# Patient Record
Sex: Female | Born: 2005 | Race: White | Hispanic: No | Marital: Single | Smoking: Never smoker
Health system: Southern US, Community
[De-identification: ages and names within clinical notes are randomized; demographics above are authoritative.]

## PROBLEM LIST (undated history)

## (undated) DIAGNOSIS — J302 Other seasonal allergic rhinitis: Secondary | ICD-10-CM

## (undated) DIAGNOSIS — J45909 Unspecified asthma, uncomplicated: Secondary | ICD-10-CM

## (undated) DIAGNOSIS — E669 Obesity, unspecified: Secondary | ICD-10-CM

## (undated) HISTORY — DX: Obesity, unspecified: E66.9

## (undated) HISTORY — DX: Unspecified asthma, uncomplicated: J45.909

---
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
## 2024-01-06 DIAGNOSIS — Z0000 Encounter for general adult medical examination without abnormal findings: Secondary | ICD-10-CM | POA: Diagnosis not present

## 2024-01-06 DIAGNOSIS — I10 Essential (primary) hypertension: Secondary | ICD-10-CM | POA: Diagnosis not present

## 2024-01-06 DIAGNOSIS — Z1159 Encounter for screening for other viral diseases: Secondary | ICD-10-CM | POA: Diagnosis not present

## 2024-01-06 DIAGNOSIS — Z6841 Body Mass Index (BMI) 40.0 and over, adult: Secondary | ICD-10-CM | POA: Diagnosis not present

## 2024-01-06 DIAGNOSIS — E559 Vitamin D deficiency, unspecified: Secondary | ICD-10-CM | POA: Diagnosis not present

## 2024-01-06 DIAGNOSIS — M129 Arthropathy, unspecified: Secondary | ICD-10-CM | POA: Diagnosis not present

## 2024-01-06 DIAGNOSIS — R0602 Shortness of breath: Secondary | ICD-10-CM | POA: Diagnosis not present

## 2024-01-06 DIAGNOSIS — D539 Nutritional anemia, unspecified: Secondary | ICD-10-CM | POA: Diagnosis not present

## 2024-01-06 DIAGNOSIS — E039 Hypothyroidism, unspecified: Secondary | ICD-10-CM | POA: Diagnosis not present

## 2024-01-28 DIAGNOSIS — J310 Chronic rhinitis: Secondary | ICD-10-CM | POA: Diagnosis not present

## 2024-01-28 DIAGNOSIS — I10 Essential (primary) hypertension: Secondary | ICD-10-CM | POA: Diagnosis not present

## 2024-01-28 DIAGNOSIS — E559 Vitamin D deficiency, unspecified: Secondary | ICD-10-CM | POA: Diagnosis not present

## 2024-01-28 DIAGNOSIS — Z6841 Body Mass Index (BMI) 40.0 and over, adult: Secondary | ICD-10-CM | POA: Diagnosis not present

## 2024-01-28 DIAGNOSIS — F419 Anxiety disorder, unspecified: Secondary | ICD-10-CM | POA: Diagnosis not present

## 2024-01-28 DIAGNOSIS — Z01419 Encounter for gynecological examination (general) (routine) without abnormal findings: Secondary | ICD-10-CM | POA: Diagnosis not present

## 2024-01-28 DIAGNOSIS — E7800 Pure hypercholesterolemia, unspecified: Secondary | ICD-10-CM | POA: Diagnosis not present

## 2024-01-28 DIAGNOSIS — F32A Depression, unspecified: Secondary | ICD-10-CM | POA: Diagnosis not present

## 2024-02-20 DIAGNOSIS — F332 Major depressive disorder, recurrent severe without psychotic features: Secondary | ICD-10-CM | POA: Diagnosis not present

## 2024-02-20 DIAGNOSIS — F411 Generalized anxiety disorder: Secondary | ICD-10-CM | POA: Diagnosis not present

## 2024-02-20 DIAGNOSIS — F4522 Body dysmorphic disorder: Secondary | ICD-10-CM | POA: Diagnosis not present

## 2024-02-24 DIAGNOSIS — Z0120 Encounter for dental examination and cleaning without abnormal findings: Secondary | ICD-10-CM | POA: Diagnosis not present

## 2024-03-03 DIAGNOSIS — J069 Acute upper respiratory infection, unspecified: Secondary | ICD-10-CM | POA: Diagnosis not present

## 2024-03-03 DIAGNOSIS — R509 Fever, unspecified: Secondary | ICD-10-CM | POA: Diagnosis not present

## 2024-03-06 DIAGNOSIS — D539 Nutritional anemia, unspecified: Secondary | ICD-10-CM | POA: Diagnosis not present

## 2024-03-06 DIAGNOSIS — K5909 Other constipation: Secondary | ICD-10-CM | POA: Diagnosis not present

## 2024-03-06 DIAGNOSIS — M129 Arthropathy, unspecified: Secondary | ICD-10-CM | POA: Diagnosis not present

## 2024-03-06 DIAGNOSIS — A09 Infectious gastroenteritis and colitis, unspecified: Secondary | ICD-10-CM | POA: Diagnosis not present

## 2024-03-06 DIAGNOSIS — Z6841 Body Mass Index (BMI) 40.0 and over, adult: Secondary | ICD-10-CM | POA: Diagnosis not present

## 2024-03-06 DIAGNOSIS — R5383 Other fatigue: Secondary | ICD-10-CM | POA: Diagnosis not present

## 2024-03-06 DIAGNOSIS — R112 Nausea with vomiting, unspecified: Secondary | ICD-10-CM | POA: Diagnosis not present

## 2024-03-06 DIAGNOSIS — E559 Vitamin D deficiency, unspecified: Secondary | ICD-10-CM | POA: Diagnosis not present

## 2024-03-18 DIAGNOSIS — Z0120 Encounter for dental examination and cleaning without abnormal findings: Secondary | ICD-10-CM | POA: Diagnosis not present

## 2024-03-19 DIAGNOSIS — F419 Anxiety disorder, unspecified: Secondary | ICD-10-CM | POA: Diagnosis not present

## 2024-03-19 DIAGNOSIS — E611 Iron deficiency: Secondary | ICD-10-CM | POA: Diagnosis not present

## 2024-03-19 DIAGNOSIS — E7800 Pure hypercholesterolemia, unspecified: Secondary | ICD-10-CM | POA: Diagnosis not present

## 2024-03-19 DIAGNOSIS — Z01419 Encounter for gynecological examination (general) (routine) without abnormal findings: Secondary | ICD-10-CM | POA: Diagnosis not present

## 2024-03-19 DIAGNOSIS — F32A Depression, unspecified: Secondary | ICD-10-CM | POA: Diagnosis not present

## 2024-03-19 DIAGNOSIS — E559 Vitamin D deficiency, unspecified: Secondary | ICD-10-CM | POA: Diagnosis not present

## 2024-03-19 DIAGNOSIS — A09 Infectious gastroenteritis and colitis, unspecified: Secondary | ICD-10-CM | POA: Diagnosis not present

## 2024-03-19 DIAGNOSIS — I10 Essential (primary) hypertension: Secondary | ICD-10-CM | POA: Diagnosis not present

## 2024-03-19 DIAGNOSIS — J310 Chronic rhinitis: Secondary | ICD-10-CM | POA: Diagnosis not present

## 2024-03-22 DIAGNOSIS — Z0120 Encounter for dental examination and cleaning without abnormal findings: Secondary | ICD-10-CM | POA: Diagnosis not present

## 2024-03-31 DIAGNOSIS — F411 Generalized anxiety disorder: Secondary | ICD-10-CM | POA: Diagnosis not present

## 2024-03-31 DIAGNOSIS — F332 Major depressive disorder, recurrent severe without psychotic features: Secondary | ICD-10-CM | POA: Diagnosis not present

## 2024-03-31 DIAGNOSIS — F4522 Body dysmorphic disorder: Secondary | ICD-10-CM | POA: Diagnosis not present

## 2024-04-09 DIAGNOSIS — F332 Major depressive disorder, recurrent severe without psychotic features: Secondary | ICD-10-CM | POA: Diagnosis not present

## 2024-04-09 DIAGNOSIS — F411 Generalized anxiety disorder: Secondary | ICD-10-CM | POA: Diagnosis not present

## 2024-04-09 DIAGNOSIS — F4522 Body dysmorphic disorder: Secondary | ICD-10-CM | POA: Diagnosis not present

## 2024-09-20 ENCOUNTER — Other Ambulatory Visit: Payer: Self-pay

## 2024-09-20 ENCOUNTER — Ambulatory Visit

## 2024-09-20 DIAGNOSIS — R262 Difficulty in walking, not elsewhere classified: Secondary | ICD-10-CM | POA: Insufficient documentation

## 2024-09-20 DIAGNOSIS — M25562 Pain in left knee: Secondary | ICD-10-CM | POA: Insufficient documentation

## 2024-09-20 DIAGNOSIS — M6281 Muscle weakness (generalized): Secondary | ICD-10-CM | POA: Insufficient documentation

## 2024-09-20 NOTE — Therapy (Signed)
 OUTPATIENT PHYSICAL THERAPY LOWER EXTREMITY EVALUATION   Patient Name: Latoya Castillo MRN: [id number] DOB:02/06/2006, 18 y.o., female Today's Date: 09/20/2024  END OF SESSION:  PT End of Session - 09/20/24 1604     Visit Number 1    Date for Recertification  12/13/24    PT Start Time 1351    PT Stop Time 1430    PT Time Calculation (min) 39 min    Activity Tolerance Patient tolerated treatment well    Behavior During Therapy [facility] for tasks assessed/performed          Past Medical History:  Diagnosis Date   Asthma    Obesity    Seasonal allergies    History reviewed. No pertinent surgical history. 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   Goiter 10/24/2016   Insulin  resistance 10/24/2016    PCP: [facility]  REFERRING PROVIDER: Juliene Bailey, MD  REFERRING DIAG: L patellar subluxation  THERAPY DIAG:  Acute pain of left knee - Plan: PT plan of care cert/re-cert  Difficulty in walking, not elsewhere classified - Plan: PT plan of care cert/re-cert  Muscle weakness (generalized) - Plan: PT plan of care cert/re-cert  Rationale for Evaluation and Treatment: Rehabilitation  ONSET DATE: Jul 25 2024  SUBJECTIVE:   SUBJECTIVE STATEMENT: Reports pain and swelling L knee, fearful to walk much   PERTINENT HISTORY: Fell at home, 2 x in a row, felt like something moved in her L knee, now pain and swelling PAIN:  Are you having pain? Yes: NPRS scale: 0 to 5 Pain location: L knee primarily inferior patellar tendon Pain description: aching, more stiff in am Aggravating factors: walking, lying supine  Relieving factors: rest  PRECAUTIONS: None  RED FLAGS: None   WEIGHT BEARING RESTRICTIONS:  No  FALLS:  Has patient fallen in last 6 months? Yes. 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  DIAGNOSTIC FINDINGS: not available  PATIENT SURVEYS:  Lower Extremity Functional Score: 35 / 80 = 43.8 %  COGNITION: Overall cognitive status: Within functional limits for tasks assessed     SENSATION: WFL  EDEMA:  Mod edema knee jt anterior  MUSCLE LENGTH: Hamstrings: Right wfl deg; Left wfl deg Debby test: Right wfl deg; Left wfl deg  POSTURE: No Significant postural limitations  PALPATION: Tender inferior patellar tendon. Normal movement patella all planes  LOWER EXTREMITY ROM:  Active ROM Right eval Left eval  Hip flexion    Hip extension    Hip abduction    Hip adduction    Hip internal rotation    Hip external rotation    Knee flexion 148 120  Knee extension 0 -20  Ankle dorsiflexion    Ankle plantarflexion    Ankle inversion    Ankle eversion     (Blank rows =  not tested)  LOWER EXTREMITY MMT:  MMT Right eval Left eval  Hip flexion    Hip extension    Hip abduction  Pain L knee, 4/5  Hip adduction    Hip internal rotation    Hip external rotation    Knee flexion  4/5  Knee extension  3+/5 pain  Ankle dorsiflexion  heel walk unable unable  Ankle plantarflexion toe walk unable unable  Ankle inversion    Ankle eversion     (Blank rows = not tested)  LOWER EXTREMITY SPECIAL TESTS:  na  FUNCTIONAL TESTS:  30 seconds chair stand test 9  GAIT: Distance walked: in clinic with brace on L  knee Assistive device utilized: knee brace                                                                                                                                TREATMENT DATE: 09/20/24: Evaluation, education throughout regarding nature of her injury, expectations for recovery and time frame and purpose of exercise     PATIENT EDUCATION:  Education details: POC, goals Person educated: Patient Education method: Programmer, Multimedia, Facilities Manager, Verbal cues, and Handouts Education comprehension: verbalized understanding  HOME EXERCISE PROGRAM: Instructed in use of blue t band for L long arc quads and for assisted seated SLR with L LE ER  ASSESSMENT:  CLINICAL IMPRESSION: Patient is a 18 y.o. female who was evaluated today by physical therapy for a diagnosis of L patellar dislocation, approximately 8 weeks ago.  She presents with edema L knee, pain infrapatellar tendon with palpation,decreased L knee ROM, weakness L LE, primarily L quadriceps. She should benefit from guided physical therapy to address her pain and dysfunction L LE.    OBJECTIVE IMPAIRMENTS: decreased activity tolerance, decreased balance, decreased endurance, decreased knowledge of condition, difficulty walking, decreased ROM, decreased strength, increased edema, impaired perceived functional ability, impaired flexibility, improper body mechanics, and pain.   ACTIVITY LIMITATIONS: carrying, lifting, bending, standing, squatting, transfers, and locomotion level  PARTICIPATION LIMITATIONS: cleaning, laundry, shopping, occupation, yard work, and school  PERSONAL FACTORS: Age, Behavior pattern, Education, Fitness, Past/current experiences, Time since onset of injury/illness/exacerbation, and 1-2 comorbidities: obesity,  are also affecting patient's functional outcome.   REHAB POTENTIAL: Good  CLINICAL DECISION MAKING: Evolving/moderate complexity  EVALUATION COMPLEXITY: Moderate   GOALS: Goals reviewed with patient?  Yes  SHORT TERM GOALS: Target date: 2 weeks, 10/04/25 I HEP Baseline:initiated today Goal status: INITIAL   LONG TERM GOALS: Target date: 12/13/24, 12 weeks   30 sec sit to stand greater than 18 without L knee pain Baseline:  Goal status: INITIAL  2.  LEFS improve from 43.8% deficit to 33% or less Baseline:  Goal status: INITIAL  3.  MMT L LE wnl Baseline: see chart Goal status: INITIAL  4.  Full ROM L knee for efficient sit to stand, mobility Baseline:  Goal status: INITIAL   PLAN:  PT FREQUENCY: 1-2x/week  PT DURATION:  12 weeks  PLANNED INTERVENTIONS: 97110-Therapeutic exercises, 97530- Therapeutic activity, [id number]- Neuromuscular re-education, 97535- Self Care, [id number]- Manual therapy, and Patient/Family education  PLAN FOR NEXT SESSION: how was the exercises, progress with stability, ROM, strengthening B LE's    Shevonne Wolf L Shironda Kain, PT, DPT, OCS 09/20/2024, 4:39 PM

## 2024-09-27 ENCOUNTER — Ambulatory Visit: Admitting: Physical Therapy

## 2024-09-29 ENCOUNTER — Ambulatory Visit: Admitting: Physical Therapy

## 2024-10-04 ENCOUNTER — Ambulatory Visit: Admitting: Physical Therapy

## 2024-10-05 ENCOUNTER — Ambulatory Visit: Admitting: Physical Therapy

## 2024-10-06 ENCOUNTER — Ambulatory Visit: Admitting: Physical Therapy

## 2024-10-06 ENCOUNTER — Encounter: Payer: Self-pay | Admitting: Physical Therapy

## 2024-10-06 DIAGNOSIS — R262 Difficulty in walking, not elsewhere classified: Secondary | ICD-10-CM

## 2024-10-06 DIAGNOSIS — M25562 Pain in left knee: Secondary | ICD-10-CM

## 2024-10-06 DIAGNOSIS — M6281 Muscle weakness (generalized): Secondary | ICD-10-CM

## 2024-10-06 NOTE — Therapy (Signed)
 " OUTPATIENT PHYSICAL THERAPY LOWER EXTREMITY TREATMENT   Patient Name: Latoya Castillo MRN: [id number] DOB:July 15, 2006, 18 y.o., female Today's Date: 10/06/2024  END OF SESSION:  PT End of Session - 10/06/24 1316     Visit Number 2    Date for Recertification  12/13/24    PT Start Time 1315    PT Stop Time 1400    PT Time Calculation (min) 45 min    Activity Tolerance Patient tolerated treatment well    Behavior During Therapy [facility] for tasks assessed/performed          Past Medical History:  Diagnosis Date   Asthma    Obesity    Seasonal allergies    History reviewed. No pertinent surgical history. 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   Goiter 10/24/2016   Insulin  resistance 10/24/2016    PCP: [facility]  REFERRING PROVIDER: Juliene Bailey, MD  REFERRING DIAG: L patellar subluxation  THERAPY DIAG:  Acute pain of left knee  Difficulty in walking, not elsewhere classified  Muscle weakness (generalized)  Rationale for Evaluation and Treatment: Rehabilitation  ONSET DATE: Jul 25 2024  SUBJECTIVE:   SUBJECTIVE STATEMENT: Reports doing okay, thinks the exercises are doing well  PERTINENT HISTORY: Fell at home, 2 x in a row, felt like something moved in her L knee, now pain and swelling PAIN:  Are you having pain? Yes: NPRS scale: 0 to 5 Pain location: L knee primarily inferior patellar tendon Pain description: aching, more stiff in am Aggravating factors: walking, lying supine  Relieving factors: rest  PRECAUTIONS: None  RED FLAGS: None   WEIGHT BEARING RESTRICTIONS: No  FALLS:  Has patient fallen in last 6 months? Yes. 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  DIAGNOSTIC FINDINGS: not available  PATIENT SURVEYS:  Lower Extremity Functional Score: 35 / 80 = 43.8 %  COGNITION: Overall cognitive status: Within functional limits for tasks assessed     SENSATION: WFL  EDEMA:  Mod edema knee jt anterior  MUSCLE LENGTH: Hamstrings: Right wfl deg; Left wfl deg Debby test: Right wfl deg; Left wfl deg  POSTURE: No Significant postural limitations  PALPATION: Tender inferior patellar tendon. Normal movement patella all planes  LOWER EXTREMITY ROM:  Active ROM Right eval Left eval  Hip flexion    Hip extension    Hip abduction    Hip adduction    Hip internal rotation    Hip external rotation    Knee flexion 148 120  Knee extension 0 -20  Ankle dorsiflexion    Ankle plantarflexion    Ankle inversion    Ankle eversion     (Blank rows = not tested)  LOWER EXTREMITY MMT:  MMT Right eval Left eval  Hip flexion    Hip extension  Hip abduction  Pain L knee, 4/5  Hip adduction    Hip internal rotation    Hip external rotation    Knee flexion  4/5  Knee extension  3+/5 pain  Ankle dorsiflexion  heel walk unable unable  Ankle plantarflexion toe walk unable unable  Ankle inversion    Ankle eversion     (Blank rows = not tested)  LOWER EXTREMITY SPECIAL TESTS:  na  FUNCTIONAL TESTS:  30 seconds chair stand test 9  GAIT: Distance walked: in clinic with brace on L knee Assistive device utilized: knee brace                                                                                                                                 TREATMENT DATE:  10/06/24 Nustep level 5 x 5 minutes Calf stretch Feet on ball K2C, rotation, bridge, isometric abs SAQ tactile and verbal cues QS Passive ITB stretch Leg curls 25# Leg press 20# small ROM focus on slow controlled motions   09/20/24: Evaluation, education throughout regarding nature of her injury, expectations for recovery and time frame and purpose of exercise     PATIENT EDUCATION:  Education details: POC, goals Person educated: Patient Education method: Programmer, Multimedia, Facilities Manager, Verbal cues, and Handouts Education comprehension: verbalized understanding  HOME EXERCISE PROGRAM: Instructed in use of blue t band for L long arc quads and for assisted seated SLR with L LE ER  ASSESSMENT:  CLINICAL IMPRESSION: Reports no problems, still with pain and fear of motions, she tolerated the exercises okay but again some fear.  Reports pain getting up from her bed. With verbal and tactile cues she was able to get a good VMO contraction  Patient is a 18 y.o. female who was evaluated today by physical therapy for a diagnosis of L patellar dislocation, approximately 8 weeks ago.  She presents with edema L knee, pain infrapatellar tendon with palpation,decreased L knee ROM, weakness L LE, primarily L quadriceps. She should benefit from guided physical therapy to address her pain and dysfunction L LE.    OBJECTIVE IMPAIRMENTS: decreased activity tolerance, decreased balance, decreased endurance, decreased knowledge of condition, difficulty walking, decreased ROM, decreased strength, increased edema, impaired perceived functional ability, impaired flexibility, improper body mechanics, and pain.   ACTIVITY LIMITATIONS: carrying, lifting, bending, standing, squatting, transfers, and locomotion level  PARTICIPATION LIMITATIONS: cleaning, laundry, shopping, occupation, yard work, and school  PERSONAL FACTORS: Age, Behavior  pattern, Education, Fitness, Past/current experiences, Time since onset of injury/illness/exacerbation, and 1-2 comorbidities: obesity,  are also affecting patient's functional outcome.   REHAB POTENTIAL: Good  CLINICAL DECISION MAKING: Evolving/moderate complexity  EVALUATION COMPLEXITY: Moderate   GOALS: Goals reviewed with patient? Yes  SHORT TERM GOALS: Target date: 2 weeks, 10/04/25 I HEP Baseline:initiated today Goal status: met 10/06/24   LONG TERM GOALS: Target date: 12/13/24, 12 weeks   30 sec sit to stand greater than 18 without L knee pain Baseline:  Goal status: INITIAL  2.  LEFS improve from 43.8% deficit to 33% or less Baseline:  Goal status: INITIAL  3.  MMT L LE wnl Baseline: see chart Goal status: INITIAL  4.  Full ROM L knee for efficient sit to stand, mobility Baseline:  Goal status: INITIAL   PLAN:  PT FREQUENCY: 1-2x/week  PT DURATION: 12 weeks  PLANNED INTERVENTIONS: 97110-Therapeutic exercises, 97530- Therapeutic activity, 97112- Neuromuscular re-education, 97535- Self Care, [id number]- Manual therapy, and Patient/Family education  PLAN FOR NEXT SESSION: how was the exercises, progress with stability, ROM, strengthening B LE's , continue to progress   Chassidy Layson W, PT,  10/06/2024, 1:16 PM  "

## 2024-10-11 ENCOUNTER — Ambulatory Visit

## 2024-10-11 NOTE — Therapy (Deleted)
 " OUTPATIENT PHYSICAL THERAPY LOWER EXTREMITY TREATMENT   Patient Name: Latoya Castillo MRN: [id number] DOB:2006-01-25, 18 y.o., female Today's Date: 10/11/2024  END OF SESSION:    Past Medical History:  Diagnosis Date   Asthma    Obesity    Seasonal allergies    No past surgical history on file. 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   Goiter 10/24/2016   Insulin  resistance 10/24/2016    PCP: [facility]  REFERRING PROVIDER: Juliene Bailey, MD  REFERRING DIAG: L patellar subluxation  THERAPY DIAG:  No diagnosis found.  Rationale for Evaluation and Treatment: Rehabilitation  ONSET DATE: Jul 25 2024  SUBJECTIVE:   SUBJECTIVE STATEMENT: Reports doing okay, thinks the exercises are doing well  PERTINENT HISTORY: Fell at home, 2 x in a row, felt like something moved in her L knee, now pain and swelling PAIN:  Are you having pain? Yes: NPRS scale: 0 to 5 Pain location: L knee primarily inferior patellar tendon Pain description: aching, more stiff in am Aggravating factors: walking, lying supine  Relieving factors: rest  PRECAUTIONS: None  RED FLAGS: None   WEIGHT BEARING RESTRICTIONS: No  FALLS:  Has patient fallen in last 6 months? Yes. 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  DIAGNOSTIC FINDINGS: not available  PATIENT SURVEYS:  Lower Extremity Functional Score: 35 / 80 = 43.8 %  COGNITION: Overall cognitive status: Within functional limits for tasks assessed     SENSATION: WFL  EDEMA:  Mod edema knee jt anterior  MUSCLE LENGTH: Hamstrings: Right wfl deg; Left wfl deg Debby test: Right wfl deg; Left wfl deg  POSTURE: No Significant postural limitations  PALPATION: Tender inferior patellar tendon. Normal movement patella all planes  LOWER EXTREMITY ROM:  Active ROM Right eval Left eval  Hip flexion    Hip extension    Hip abduction    Hip adduction    Hip internal rotation    Hip external rotation    Knee flexion 148 120  Knee extension 0 -20  Ankle dorsiflexion    Ankle plantarflexion    Ankle inversion    Ankle eversion     (Blank rows = not tested)  LOWER EXTREMITY MMT:  MMT Right eval Left eval  Hip flexion    Hip extension    Hip abduction  Pain L knee, 4/5  Hip adduction    Hip internal rotation    Hip external rotation    Knee flexion  4/5  Knee extension  3+/5 pain  Ankle dorsiflexion  heel walk unable unable  Ankle plantarflexion toe walk unable unable  Ankle inversion    Ankle eversion     (Blank rows = not tested)  LOWER EXTREMITY SPECIAL TESTS:  na  FUNCTIONAL TESTS:  30 seconds chair stand test  9  GAIT: Distance walked: in clinic with brace on L knee Assistive device utilized: knee brace                                                                                                                                TREATMENT DATE:  10/06/24 Nustep level 5 x 5 minutes Calf stretch Feet on ball K2C, rotation, bridge, isometric abs SAQ tactile and verbal cues QS Passive ITB stretch Leg curls 25# Leg press 20# small ROM focus on slow controlled motions   09/20/24: Evaluation, education throughout regarding nature of her injury, expectations for  recovery and time frame and purpose of exercise     PATIENT EDUCATION:  Education details: POC, goals Person educated: Patient Education method: Programmer, Multimedia, Facilities Manager, Verbal cues, and Handouts Education comprehension: verbalized understanding  HOME EXERCISE PROGRAM: Instructed in use of blue t band for L long arc quads and for assisted seated SLR with L LE ER  ASSESSMENT:  CLINICAL IMPRESSION: Reports no problems, still with pain and fear of motions, she tolerated the exercises okay but again some fear.  Reports pain getting up from her bed. With verbal and tactile cues she was able to get a good VMO contraction  Patient is a 18 y.o. female who was evaluated today by physical therapy for a diagnosis of L patellar dislocation, approximately 8 weeks ago.  She presents with edema L knee, pain infrapatellar tendon with palpation,decreased L knee ROM, weakness L LE, primarily L quadriceps. She should benefit from guided physical therapy to address her pain and dysfunction L LE.    OBJECTIVE IMPAIRMENTS: decreased activity tolerance, decreased balance, decreased endurance, decreased knowledge of condition, difficulty walking, decreased ROM, decreased strength, increased edema, impaired perceived functional ability, impaired flexibility, improper body mechanics, and pain.   ACTIVITY LIMITATIONS: carrying, lifting, bending, standing, squatting, transfers, and locomotion level  PARTICIPATION LIMITATIONS: cleaning, laundry, shopping, occupation, yard work, and school  PERSONAL FACTORS: Age, Behavior pattern, Education, Fitness, Past/current experiences, Time since onset of injury/illness/exacerbation, and 1-2 comorbidities: obesity,  are also affecting patient's functional outcome.   REHAB POTENTIAL: Good  CLINICAL DECISION MAKING: Evolving/moderate complexity  EVALUATION COMPLEXITY: Moderate   GOALS: Goals reviewed with patient? Yes  SHORT TERM GOALS: Target date: 2 weeks,  10/04/25 I HEP Baseline:initiated today Goal status: met 10/06/24   LONG TERM GOALS: Target date: 12/13/24, 12 weeks   30 sec sit to stand greater than 18 without L knee pain Baseline:  Goal status: INITIAL  2.  LEFS improve from 43.8% deficit to 33% or less Baseline:  Goal status: INITIAL  3.  MMT L LE wnl Baseline: see chart Goal status: INITIAL  4.  Full ROM L knee for efficient sit to stand, mobility Baseline:  Goal status: INITIAL   PLAN:  PT FREQUENCY: 1-2x/week  PT DURATION: 12 weeks  PLANNED INTERVENTIONS: 97110-Therapeutic exercises, 97530- Therapeutic activity, [id number]- Neuromuscular re-education, 97535- Self Care, [id number]- Manual therapy, and Patient/Family  education  PLAN FOR NEXT SESSION: how was the exercises, progress with stability, ROM, strengthening B LE's , continue to progress   Declan Adamson L Hale Chalfin, PT,  10/11/2024, 8:55 AM  "

## 2024-10-12 ENCOUNTER — Ambulatory Visit: Admitting: Physical Therapy

## 2024-10-12 ENCOUNTER — Encounter: Payer: Self-pay | Admitting: Physical Therapy

## 2024-10-12 DIAGNOSIS — R262 Difficulty in walking, not elsewhere classified: Secondary | ICD-10-CM

## 2024-10-12 DIAGNOSIS — M25562 Pain in left knee: Secondary | ICD-10-CM

## 2024-10-12 DIAGNOSIS — M6281 Muscle weakness (generalized): Secondary | ICD-10-CM

## 2024-10-12 NOTE — Therapy (Signed)
 " OUTPATIENT PHYSICAL THERAPY LOWER EXTREMITY TREATMENT   Patient Name: Latoya Castillo MRN: [id number] DOB:Sep 01, 2006, 18 y.o., female Today's Date: 10/12/2024  END OF SESSION:  PT End of Session - 10/12/24 1701     Visit Number 3    Date for Recertification  12/13/24    Authorization Type Medicaid 3 of 27    PT Start Time 1700    PT Stop Time 1745    PT Time Calculation (min) 45 min    Activity Tolerance Patient tolerated treatment well    Behavior During Therapy WFL for tasks assessed/performed           Past Medical History:  Diagnosis Date   Asthma    Obesity    Seasonal allergies    History reviewed. No pertinent surgical history. 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   Goiter 10/24/2016   Insulin  resistance 10/24/2016    PCP: [facility]  REFERRING PROVIDER: Juliene Bailey, MD  REFERRING DIAG: L patellar subluxation  THERAPY DIAG:  Acute pain of left knee  Difficulty in walking, not elsewhere classified  Muscle weakness (generalized)  Rationale for Evaluation and Treatment: Rehabilitation  ONSET DATE: Jul 25 2024  SUBJECTIVE:   SUBJECTIVE STATEMENT: I was a little sore.  No buckling recently  PERTINENT HISTORY: Fell at home, 2 x in a row, felt like something moved in her L knee, now pain and swelling PAIN:  Are you having pain? Yes: NPRS scale: 0 to 5 Pain location: L knee primarily inferior patellar tendon Pain description: aching, more stiff in am Aggravating factors: walking, lying supine  Relieving factors: rest  PRECAUTIONS: None  RED FLAGS: None   WEIGHT BEARING RESTRICTIONS: No  FALLS:  Has patient fallen in last 6 months? Yes. 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  DIAGNOSTIC FINDINGS: not available  PATIENT SURVEYS:  Lower Extremity Functional Score: 35 / 80 = 43.8 %  COGNITION: Overall cognitive status: Within functional limits for tasks assessed     SENSATION: WFL  EDEMA:  Mod edema knee jt anterior  MUSCLE LENGTH: Hamstrings: Right wfl deg; Left wfl deg Debby test: Right wfl deg; Left wfl deg  POSTURE: No Significant postural limitations  PALPATION: Tender inferior patellar tendon. Normal movement patella all planes  LOWER EXTREMITY ROM:  Active ROM Right eval Left eval Left  AROM 10/12/24  Hip flexion     Hip extension     Hip abduction     Hip adduction     Hip internal rotation     Hip external rotation     Knee flexion 148 120 126  Knee extension 0 -20 5  Ankle dorsiflexion     Ankle plantarflexion     Ankle inversion     Ankle eversion      (  Blank rows = not tested)  LOWER EXTREMITY MMT:  MMT Right eval Left eval  Hip flexion    Hip extension    Hip abduction  Pain L knee, 4/5  Hip adduction    Hip internal rotation    Hip external rotation    Knee flexion  4/5  Knee extension  3+/5 pain  Ankle dorsiflexion  heel walk unable unable  Ankle plantarflexion toe walk unable unable  Ankle inversion    Ankle eversion     (Blank rows = not tested)  LOWER EXTREMITY SPECIAL TESTS:  na  FUNCTIONAL TESTS:  30 seconds chair stand test 9  GAIT: Distance walked: in clinic with brace on L knee Assistive device utilized: knee  brace                                                                                                                                TREATMENT DATE:  10/12/24 Nustep level 5 x 6 minutes Calf stretch 50# resisted gait all directions, CGA for this due to LOB 25# leg curls Leg press 20# small ROM, controlled motions, then left only no weight, then 20# left only 2x5 On airex cone toe touches On airex ball toss 5# SAQ 2x10, tactile cues for VMO Feet on ball K2C, rotation, bridge and isometric abs Ball b/n knees squeeze  10/06/24 Nustep level 5 x 5 minutes Calf stretch Feet on ball K2C, rotation, bridge, isometric abs SAQ tactile and verbal cues QS Passive ITB stretch Leg curls 25# Leg press 20# small ROM focus on slow controlled motions   09/20/24: Evaluation, education throughout regarding nature of her injury, expectations for recovery and time frame and purpose of exercise     PATIENT EDUCATION:  Education details: POC, goals Person educated: Patient Education method: Programmer, Multimedia, Facilities Manager, Verbal cues, and Handouts Education comprehension: verbalized understanding  HOME EXERCISE PROGRAM: Instructed in use of blue t band for L long arc quads and for assisted seated SLR with L LE ER  ASSESSMENT:  CLINICAL IMPRESSION: Reports no problems, she is moving better, she is less fearful today, still with some verbal and tactile cues needed to get TKE and VMO to activate .    Reports pain getting up from her bed. I started some proprioception with her and she did well, occasional off balance but able to correct on own.  Patient is a 18 y.o. female who was evaluated today by physical therapy for a diagnosis of L patellar dislocation, approximately 8 weeks ago.  She presents with edema L knee, pain infrapatellar tendon with palpation,decreased L knee ROM, weakness L LE, primarily L quadriceps. She should benefit from guided physical therapy to address her pain and dysfunction L  LE.    OBJECTIVE IMPAIRMENTS: decreased activity tolerance, decreased balance, decreased endurance, decreased knowledge of condition, difficulty walking, decreased ROM, decreased strength, increased edema, impaired perceived functional ability, impaired flexibility, improper body mechanics, and pain.   ACTIVITY  LIMITATIONS: carrying, lifting, bending, standing, squatting, transfers, and locomotion level  PARTICIPATION LIMITATIONS: cleaning, laundry, shopping, occupation, yard work, and school  PERSONAL FACTORS: Age, Behavior pattern, Education, Fitness, Past/current experiences, Time since onset of injury/illness/exacerbation, and 1-2 comorbidities: obesity,  are also affecting patient's functional outcome.   REHAB POTENTIAL: Good  CLINICAL DECISION MAKING: Evolving/moderate complexity  EVALUATION COMPLEXITY: Moderate   GOALS: Goals reviewed with patient? Yes  SHORT TERM GOALS: Target date: 2 weeks, 10/04/25 I HEP Baseline:initiated today Goal status: met 10/06/24   LONG TERM GOALS: Target date: 12/13/24, 12 weeks   30 sec sit to stand greater than 18 without L knee pain Baseline:  Goal status: INITIAL  2.  LEFS improve from 43.8% deficit to 33% or less Baseline:  Goal status: INITIAL  3.  MMT L LE wnl Baseline: see chart Goal status: INITIAL  4.  Full ROM L knee for efficient sit to stand, mobility Baseline:  Goal status: met 10/12/24   PLAN:  PT FREQUENCY: 1-2x/week  PT DURATION: 12 weeks  PLANNED INTERVENTIONS: 97110-Therapeutic exercises, 97530- Therapeutic activity, 97112- Neuromuscular re-education, 97535- Self Care, [id number]- Manual therapy, and Patient/Family education  PLAN FOR NEXT SESSION: progress with stability, ROM, strengthening B LE's , continue to progress   OBADIAH OZELL ORN, PT,  10/12/2024, 5:02 PM  "

## 2024-10-13 ENCOUNTER — Ambulatory Visit: Admitting: Student

## 2024-10-13 ENCOUNTER — Ambulatory Visit: Admitting: Physical Therapy

## 2024-10-13 ENCOUNTER — Encounter: Payer: Self-pay | Admitting: Student

## 2024-10-13 VITALS — BP 131/82 | HR 81 | Ht 68.0 in | Wt 298.3 lb

## 2024-10-13 DIAGNOSIS — Z6841 Body Mass Index (BMI) 40.0 and over, adult: Secondary | ICD-10-CM

## 2024-10-13 DIAGNOSIS — N943 Premenstrual tension syndrome: Secondary | ICD-10-CM

## 2024-10-13 DIAGNOSIS — N926 Irregular menstruation, unspecified: Secondary | ICD-10-CM

## 2024-10-13 MED ORDER — DROSPIRENONE-ETHINYL ESTRADIOL 3-0.03 MG PO TABS: 1.0000 | ORAL_TABLET | Freq: Every day | ORAL | 11 refills | Status: AC

## 2024-10-13 NOTE — Progress Notes (Signed)
 " History:  Ms. Latoya Castillo is a 18 y.o. No obstetric history on file. who presents to clinic today for irregular periods. Patient states that periods were normal until high school. States that she sometimes goes 2-3 months without a period. Reports having painful and heavy periods after skipping cycles. She shares that her mood worsens at the onset of her periods. Patient also discloses challenges with weight stability and has noticed fluctuations.  Patient is not sexually active.  The following portions of the patient's history were reviewed and updated as appropriate: allergies, current medications, family history, past medical history, social history, past surgical history and problem list.  Review of Systems:  Review of Systems  Constitutional: Negative.   Respiratory: Negative.    Gastrointestinal: Negative.   Genitourinary: Negative.   Neurological: Negative.   Psychiatric/Behavioral: Negative.       Objective:  Physical Exam BP 131/82   Pulse 81   Ht 5' 8 (1.727 m)   Wt 298 lb 4.8 oz (135.3 kg)   LMP 10/05/2024 (Exact Date)   BMI 45.36 kg/m  Physical Exam Constitutional:      Appearance: Normal appearance. She is obese.  Pulmonary:     Effort: Pulmonary effort is normal.  Abdominal:     Palpations: Abdomen is soft.     Tenderness: There is no abdominal tenderness.  Genitourinary:    Comments: deferred Neurological:     Mental Status: She is oriented to person, place, and time. Mental status is at baseline.  Psychiatric:        Mood and Affect: Mood normal.        Behavior: Behavior normal.     Labs and Imaging No results found for this or any previous visit (from the past 24 hours).  No results found.  Health Maintenance Due  Topic Date Due   HPV VACCINES (1 - 3-dose series) Never done   HIV Screening  Never done   Meningococcal B Vaccine (1 of 2 - Standard) Never done   Hepatitis C Screening  Never done   DTaP/Tdap/Td (1 - Tdap) Never done    Pneumococcal Vaccine (1 of 2 - PCV) Never done   Hepatitis B Vaccines 19-59 Average Risk (1 of 3 - 19+ 3-dose series) Never done   Influenza Vaccine  Never done   COVID-19 Vaccine (1 - 2025-26 season) Never done    Labs, imaging and previous visits in Epic and Care Everywhere reviewed  Assessment & Plan:  1. Irregular periods/menstrual cycles (Primary) - Discussed potential causes of irregular uterine bleeding, including lifestyle factors (age-related changes, diet, exercise, sleep patterns, and stress), insulin  resistance, polycystic ovary syndrome (PCOS), hyperandrogenism, estrogen imbalance, primary ovarian insufficiency, and obesity. Reviewed that menstrual irregularities are often multifactorial. Explained that primary ovarian insufficiency is unlikely given the patients age and lack of symptoms associated with hypoestrogenism. Discussed diagnostic options, including evaluation of pituitary hormone function, as an initial step in ruling out etiologies of abnormal uterine bleeding (AUB). Also discussed initiation of COC for bleeding stability.  - Discussed option for TVUS as secondary work-up following results from today's labs  - HgB A1c - Testosterone - FSH - TSH Rfx on Abnormal to Free T4  2. PMS (premenstrual syndrome) - Discussed trial of COC for support of mood surrounding menstrual cycles - drospirenone-ethinyl estradiol (YASMIN 28) 3-0.03 MG tablet; Take 1 tablet by mouth daily.  Dispense: 28 tablet; Refill: 11  3. BMI 45.0-49.9, adult [facility]) - Discussed impact of hormone regulation on weight  loss  - Encouragement provided to patient for initiating lifestyle changes that feel feasible and obtainable. Continuous support offered as weight has had emotional impact on patient - Ambulatory referral to [facility] - HgB A1c - Testosterone - TSH Rfx on Abnormal to Free T4  Approximately 30 minutes of total time was spent with this patient on counseling and  coordination of care.   Return in about 3 months (around 01/11/2025) for [facility] Follow-up.  Jaymond Waage, NP 10/13/2024 5:18 PM  "

## 2024-10-13 NOTE — Progress Notes (Signed)
 Pt presents for irregular periods and nipple pain while on her period. No other questions or concerns at this time. Pt would like a referral to talk to [facility].

## 2024-10-14 LAB — HEMOGLOBIN A1C
Est. average glucose Bld gHb Est-mCnc: 97 mg/dL
Hgb A1c MFr Bld: 5 % (ref 4.8–5.6)

## 2024-10-14 LAB — TESTOSTERONE: Testosterone: 41 ng/dL (ref 13–71)

## 2024-10-14 LAB — FOLLICLE STIMULATING HORMONE: FSH: 6.6 m[IU]/mL

## 2024-10-14 LAB — TSH RFX ON ABNORMAL TO FREE T4: TSH: 0.985 u[IU]/mL (ref 0.450–4.500)

## 2024-10-15 ENCOUNTER — Encounter: Payer: Self-pay | Admitting: Physical Therapy

## 2024-10-15 ENCOUNTER — Ambulatory Visit: Attending: Sports Medicine | Admitting: Physical Therapy

## 2024-10-15 DIAGNOSIS — M6281 Muscle weakness (generalized): Secondary | ICD-10-CM | POA: Insufficient documentation

## 2024-10-15 DIAGNOSIS — M25562 Pain in left knee: Secondary | ICD-10-CM | POA: Insufficient documentation

## 2024-10-15 DIAGNOSIS — R262 Difficulty in walking, not elsewhere classified: Secondary | ICD-10-CM | POA: Diagnosis present

## 2024-10-15 NOTE — Therapy (Signed)
 " OUTPATIENT PHYSICAL THERAPY LOWER EXTREMITY TREATMENT   Patient Name: Latoya Castillo MRN: [id number] DOB:Jul 22, 2006, 19 y.o., female Today's Date: 10/15/2024  END OF SESSION:  PT End of Session - 10/15/24 1023     Visit Number 4    Date for Recertification  12/13/24    Authorization Type Medicaid 4 of 27    PT Start Time 1020    PT Stop Time 1100    PT Time Calculation (min) 40 min    Activity Tolerance Patient tolerated treatment well    Behavior During Therapy WFL for tasks assessed/performed           Past Medical History:  Diagnosis Date   Asthma    Obesity    Seasonal allergies    History reviewed. No pertinent surgical history. 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   Goiter 10/24/2016   Insulin  resistance 10/24/2016    PCP: [facility]  REFERRING PROVIDER: Juliene Bailey, MD  REFERRING DIAG: L patellar subluxation  THERAPY DIAG:  Acute pain of left knee  Difficulty in walking, not elsewhere classified  Muscle weakness (generalized)  Rationale for Evaluation and Treatment: Rehabilitation  ONSET DATE: Jul 25 2024  SUBJECTIVE:   SUBJECTIVE STATEMENT: Doing better, not wearing brace today, pain a 2/10 PERTINENT HISTORY: Fell at home, 2 x in a row, felt like something moved in her L knee, now pain and swelling PAIN:  Are you having pain? Yes: NPRS scale: 0 to 5 Pain location: L knee primarily inferior patellar tendon Pain description: aching, more stiff in am Aggravating factors: walking, lying supine  Relieving factors: rest  PRECAUTIONS: None  RED FLAGS: None   WEIGHT BEARING RESTRICTIONS: No  FALLS:  Has patient fallen in last 6 months? Yes. 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  DIAGNOSTIC FINDINGS: not available  PATIENT SURVEYS:  Lower Extremity Functional Score: 35 / 80 = 43.8 %  COGNITION: Overall cognitive status: Within functional limits for tasks assessed     SENSATION: WFL  EDEMA:  Mod edema knee jt anterior  MUSCLE LENGTH: Hamstrings: Right wfl deg; Left wfl deg Debby test: Right wfl deg; Left wfl deg  POSTURE: No Significant postural limitations  PALPATION: Tender inferior patellar tendon. Normal movement patella all planes  LOWER EXTREMITY ROM:  Active ROM Right eval Left eval Left  AROM 10/12/24  Hip flexion     Hip extension     Hip abduction     Hip adduction     Hip internal rotation     Hip external rotation     Knee flexion 148 120 126  Knee extension 0 -20 5  Ankle dorsiflexion     Ankle plantarflexion     Ankle inversion     Ankle eversion      (  Blank rows = not tested)  LOWER EXTREMITY MMT:  MMT Right eval Left eval  Hip flexion    Hip extension    Hip abduction  Pain L knee, 4/5  Hip adduction    Hip internal rotation    Hip external rotation    Knee flexion  4/5  Knee extension  3+/5 pain  Ankle dorsiflexion  heel walk unable unable  Ankle plantarflexion toe walk unable unable  Ankle inversion    Ankle eversion     (Blank rows = not tested)  LOWER EXTREMITY SPECIAL TESTS:  na  FUNCTIONAL TESTS:  30 seconds chair stand test 9  GAIT: Distance walked: in clinic with brace on L knee Assistive device utilized: knee  brace                                                                                                                                TREATMENT DATE:  10/15/24 Nustep level 5 x 6 minutes Leg curls 35# 2x15 LEg press 20# then left only , small ROM Calf stretch slant board Tmill pushes fwd and bkwd Feet on ball K2C, rotation,small bridge, isometric abs Ball b/n knees squeeze Blue tband clamshells 3# SAQ verbal and tactile cues for VMO  10/12/24 Nustep level 5 x 6 minutes Calf stretch 50# resisted gait all directions, CGA for this due to LOB 25# leg curls Leg press 20# small ROM, controlled motions, then left only no weight, then 20# left only 2x5 On airex cone toe touches On airex ball toss 5# SAQ 2x10, tactile cues for VMO Feet on ball K2C, rotation, bridge and isometric abs Ball b/n knees squeeze  10/06/24 Nustep level 5 x 5 minutes Calf stretch Feet on ball K2C, rotation, bridge, isometric abs SAQ tactile and verbal cues QS Passive ITB stretch Leg curls 25# Leg press 20# small ROM focus on slow controlled motions   09/20/24: Evaluation, education throughout regarding nature of her injury, expectations for recovery and time frame and purpose of exercise     PATIENT EDUCATION:  Education details: POC, goals Person educated: Patient Education method: Programmer, Multimedia, Facilities Manager, Verbal cues, and Handouts Education comprehension: verbalized understanding  HOME EXERCISE PROGRAM: Instructed in use of blue t band for L long arc quads and for assisted seated SLR with L LE ER  ASSESSMENT:  CLINICAL IMPRESSION: Reports no problems, she is moving better, she is less fearful today, did not wear brace yesterday or today, she still requires some cues for VMO .   I started some proprioception with her and she did well, occasional off balance but able to correct on own.  Patient is a 19 y.o. female who was evaluated today by physical therapy for a diagnosis of L patellar  dislocation, approximately 8 weeks ago.  She presents with edema L knee, pain infrapatellar tendon with palpation,decreased L knee ROM, weakness L LE, primarily L quadriceps. She should benefit from guided physical therapy to address  her pain and dysfunction L LE.    OBJECTIVE IMPAIRMENTS: decreased activity tolerance, decreased balance, decreased endurance, decreased knowledge of condition, difficulty walking, decreased ROM, decreased strength, increased edema, impaired perceived functional ability, impaired flexibility, improper body mechanics, and pain.   ACTIVITY LIMITATIONS: carrying, lifting, bending, standing, squatting, transfers, and locomotion level  PARTICIPATION LIMITATIONS: cleaning, laundry, shopping, occupation, yard work, and school  PERSONAL FACTORS: Age, Behavior pattern, Education, Fitness, Past/current experiences, Time since onset of injury/illness/exacerbation, and 1-2 comorbidities: obesity,  are also affecting patient's functional outcome.   REHAB POTENTIAL: Good  CLINICAL DECISION MAKING: Evolving/moderate complexity  EVALUATION COMPLEXITY: Moderate   GOALS: Goals reviewed with patient? Yes  SHORT TERM GOALS: Target date: 2 weeks, 10/04/25 I HEP Baseline:initiated today Goal status: met 10/06/24   LONG TERM GOALS: Target date: 12/13/24, 12 weeks   30 sec sit to stand greater than 18 without L knee pain Baseline:  Goal status: INITIAL  2.  LEFS improve from 43.8% deficit to 33% or less Baseline:  Goal status: INITIAL  3.  MMT L LE wnl Baseline: see chart Goal status: INITIAL  4.  Full ROM L knee for efficient sit to stand, mobility Baseline:  Goal status: met 10/12/24   PLAN:  PT FREQUENCY: 1-2x/week  PT DURATION: 12 weeks  PLANNED INTERVENTIONS: 97110-Therapeutic exercises, 97530- Therapeutic activity, 97112- Neuromuscular re-education, 97535- Self Care, [id number]- Manual therapy, and Patient/Family education  PLAN FOR NEXT SESSION: progress  with stability, ROM, strengthening B LE's , continue to progress.  Proprioception   OBADIAH OZELL ORN, PT,  10/15/2024, 10:23 AM  "

## 2024-10-16 ENCOUNTER — Ambulatory Visit: Payer: Self-pay | Admitting: Student

## 2024-10-18 ENCOUNTER — Ambulatory Visit: Admitting: Physical Therapy

## 2024-10-18 ENCOUNTER — Encounter: Payer: Self-pay | Admitting: Physical Therapy

## 2024-10-18 DIAGNOSIS — M6281 Muscle weakness (generalized): Secondary | ICD-10-CM

## 2024-10-18 DIAGNOSIS — R262 Difficulty in walking, not elsewhere classified: Secondary | ICD-10-CM

## 2024-10-18 DIAGNOSIS — M25562 Pain in left knee: Secondary | ICD-10-CM

## 2024-10-18 NOTE — Therapy (Signed)
 " OUTPATIENT PHYSICAL THERAPY LOWER EXTREMITY TREATMENT   Patient Name: Latoya Castillo MRN: [id number] DOB:[birth year], 19 y.o., female Today's Date: 10/18/2024  END OF SESSION:  PT End of Session - 10/18/24 1659     Visit Number 5    Date for Recertification  12/13/24    Authorization Type Medicaid 5 of 27    PT Start Time 1658    PT Stop Time 1742    PT Time Calculation (min) 44 min    Activity Tolerance Patient tolerated treatment well    Behavior During Therapy WFL for tasks assessed/performed           Past Medical History:  Diagnosis Date   Asthma    Obesity    Seasonal allergies    History reviewed. No pertinent surgical history. 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   Goiter 10/24/2016   Insulin  resistance 10/24/2016    PCP: [facility]  REFERRING PROVIDER: Juliene Bailey, MD  REFERRING DIAG: L patellar subluxation  THERAPY DIAG:  Acute pain of left knee  Difficulty in walking, not elsewhere classified  Muscle weakness (generalized)  Rationale for Evaluation and Treatment: Rehabilitation  ONSET DATE: Jul 25 2024  SUBJECTIVE:   SUBJECTIVE STATEMENT: Pretty good, mms get sore after PT, no pain right now PERTINENT HISTORY: Fell at home, 2 x in a row, felt like something moved in her L knee, now pain and swelling PAIN:  Are you having pain? Yes: NPRS scale: 0 to 5 Pain location: L knee primarily inferior patellar tendon Pain description: aching, more stiff in am Aggravating factors: walking, lying supine  Relieving factors: rest  PRECAUTIONS: None  RED FLAGS: None   WEIGHT BEARING RESTRICTIONS: No  FALLS:  Has patient fallen in last 6 months? Yes. 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  DIAGNOSTIC FINDINGS: not available  PATIENT SURVEYS:  Lower Extremity Functional Score: 35 / 80 = 43.8 %  COGNITION: Overall cognitive status: Within functional limits for tasks assessed     SENSATION: WFL  EDEMA:  Mod edema knee jt anterior  MUSCLE LENGTH: Hamstrings: Right wfl deg; Left wfl deg Debby test: Right wfl deg; Left wfl deg  POSTURE: No Significant postural limitations  PALPATION: Tender inferior patellar tendon. Normal movement patella all planes  LOWER EXTREMITY ROM:  Active ROM Right eval Left eval Left  AROM 10/12/24  Hip flexion     Hip extension     Hip abduction     Hip adduction     Hip internal rotation     Hip external rotation     Knee flexion 148 120 126  Knee extension 0 -20 5  Ankle dorsiflexion     Ankle plantarflexion     Ankle inversion     Ankle eversion      (  Blank rows = not tested)  LOWER EXTREMITY MMT:  MMT Right eval Left eval  Hip flexion    Hip extension    Hip abduction  Pain L knee, 4/5  Hip adduction    Hip internal rotation    Hip external rotation    Knee flexion  4/5  Knee extension  3+/5 pain  Ankle dorsiflexion  heel walk unable unable  Ankle plantarflexion toe walk unable unable  Ankle inversion    Ankle eversion     (Blank rows = not tested)  LOWER EXTREMITY SPECIAL TESTS:  na  FUNCTIONAL TESTS:  30 seconds chair stand test 9  GAIT: Distance walked: in clinic with brace on L knee Assistive device utilized: knee  brace                                                                                                                                TREATMENT DATE:  10/18/24 Nustep level 5 x 6 minutes Bike Level 4 x 5 minutes a little tight with this 50# resisted gait all directions Slant board stretch Tmill pushes fwd and bkwd 20 s x 3 40# leg press, left only after 2x10, 40# x10, 20# x10 35# Leg curls 2x10 5# leg extension, small ROM and working on TKE/VMO Feet on ball K2C, bridges, isometric abs SAQ 5# verbal and tactile cues for VMO 5# hip extension and abduction  10/15/24 Nustep level 5 x 6 minutes Leg curls 35# 2x15 LEg press 20# then left only , small ROM Calf stretch slant board Tmill pushes fwd and bkwd Feet on ball K2C, rotation,small bridge, isometric abs Ball b/n knees squeeze Blue tband clamshells 3# SAQ verbal and tactile cues for VMO  10/12/24 Nustep level 5 x 6 minutes Calf stretch 50# resisted gait all directions, CGA for this due to LOB 25# leg curls Leg press 20# small ROM, controlled motions, then left only no weight, then 20# left only 2x5 On airex cone toe touches On airex ball toss 5# SAQ 2x10, tactile cues for VMO Feet on ball K2C, rotation, bridge and isometric abs Ball b/n knees squeeze  10/06/24 Nustep level 5 x 5 minutes Calf stretch Feet on ball K2C, rotation, bridge, isometric abs SAQ tactile and verbal cues QS Passive ITB stretch Leg curls 25# Leg press 20# small ROM focus on slow controlled motions   09/20/24: Evaluation, education throughout regarding nature of her injury, expectations for recovery and time frame and purpose of exercise     PATIENT EDUCATION:  Education details: POC, goals Person educated: Patient Education method: Programmer, Multimedia, Facilities Manager, Verbal cues, and Handouts Education comprehension: verbalized understanding  HOME EXERCISE PROGRAM: Instructed in use of blue t band for L long arc quads and for assisted seated SLR  with L LE ER  ASSESSMENT:  CLINICAL IMPRESSION: Reports no problems, she is moving better, she is less fearful today, has not worn the brace over the past week.  ,  she still requires some cues for VMO .   I try to continue to push strength of the VMO, and added some hip strength today, she is weak in the hips  Patient is a 19 y.o. female who was evaluated today by physical therapy for a diagnosis of L patellar dislocation, approximately 8 weeks ago.  She presents with edema L knee, pain infrapatellar tendon with palpation,decreased L knee ROM, weakness L LE, primarily L quadriceps. She should benefit from guided physical therapy to address her pain and dysfunction L LE.    OBJECTIVE IMPAIRMENTS: decreased activity tolerance, decreased balance, decreased endurance, decreased knowledge of condition, difficulty walking, decreased ROM, decreased strength, increased edema, impaired perceived functional ability, impaired flexibility, improper body mechanics, and pain.   ACTIVITY LIMITATIONS: carrying, lifting, bending, standing, squatting, transfers, and locomotion level  PARTICIPATION LIMITATIONS: cleaning, laundry, shopping, occupation, yard work, and school  PERSONAL FACTORS: Age, Behavior pattern, Education, Fitness, Past/current experiences, Time since onset of injury/illness/exacerbation, and 1-2 comorbidities: obesity,  are also affecting patient's functional outcome.   REHAB POTENTIAL: Good  CLINICAL DECISION MAKING: Evolving/moderate complexity  EVALUATION COMPLEXITY: Moderate   GOALS: Goals reviewed with patient? Yes  SHORT TERM GOALS: Target date: 2 weeks, 10/04/25 I HEP Baseline:initiated today Goal status: met 10/06/24   LONG TERM GOALS: Target date: 12/13/24, 12 weeks   30 sec sit to stand greater than 18 without L knee pain Baseline:  Goal status: INITIAL  2.  LEFS improve from 43.8% deficit to 33% or less Baseline:  Goal status: INITIAL  3.  MMT L LE wnl Baseline:  see chart Goal status: INITIAL  4.  Full ROM L knee for efficient sit to stand, mobility Baseline:  Goal status: met 10/12/24   PLAN:  PT FREQUENCY: 1-2x/week  PT DURATION: 12 weeks  PLANNED INTERVENTIONS: 97110-Therapeutic exercises, 97530- Therapeutic activity, 97112- Neuromuscular re-education, 97535- Self Care, [id number]- Manual therapy, and Patient/Family education  PLAN FOR NEXT SESSION: progress with stability, ROM, strengthening B LE's , continue to progress.  Proprioception, hip strength   Latoya Castillo, PT,  10/18/2024, 5:00 PM  "

## 2024-10-20 ENCOUNTER — Ambulatory Visit: Admitting: Physical Therapy

## 2024-10-20 ENCOUNTER — Encounter: Payer: Self-pay | Admitting: Physical Therapy

## 2024-10-20 ENCOUNTER — Telehealth: Payer: Self-pay

## 2024-10-20 DIAGNOSIS — M25562 Pain in left knee: Secondary | ICD-10-CM

## 2024-10-20 DIAGNOSIS — M6281 Muscle weakness (generalized): Secondary | ICD-10-CM

## 2024-10-20 DIAGNOSIS — R262 Difficulty in walking, not elsewhere classified: Secondary | ICD-10-CM

## 2024-10-20 NOTE — Therapy (Signed)
 " OUTPATIENT PHYSICAL THERAPY LOWER EXTREMITY TREATMENT   Patient Name: Latoya Castillo MRN: [id number] DOB:June 12, 2006, 19 y.o., female Today's Date: 10/20/2024  END OF SESSION:  PT End of Session - 10/20/24 1700     Visit Number 6    Date for Recertification  12/13/24    Authorization Type Medicaid 6 of 27    Activity Tolerance Patient tolerated treatment well    Behavior During Therapy Latoya Castillo for tasks assessed/performed           Past Medical History:  Diagnosis Date   Asthma    Obesity    Seasonal allergies    History reviewed. No pertinent surgical history. 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   Goiter 10/24/2016   Insulin  resistance 10/24/2016    PCP: [facility]  REFERRING PROVIDER: Juliene Bailey, MD  REFERRING DIAG: L patellar subluxation  THERAPY DIAG:  Acute pain of left knee  Difficulty in walking, not elsewhere classified  Muscle weakness (generalized)  Rationale for Evaluation and Treatment: Rehabilitation  ONSET DATE: Jul 25 2024  SUBJECTIVE:   SUBJECTIVE STATEMENT: No pain PERTINENT HISTORY: Fell at home, 2 x in a row, felt like something moved in her L knee, now pain and swelling PAIN:  Are you having pain? Yes: NPRS scale: 0 to 5 Pain location: L knee primarily inferior patellar tendon Pain description: aching, more stiff in am Aggravating factors: walking, lying supine  Relieving factors: rest  PRECAUTIONS: None  RED FLAGS: None   WEIGHT BEARING RESTRICTIONS: No  FALLS:  Has patient fallen in last 6 months? Yes. 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  DIAGNOSTIC FINDINGS: not available  PATIENT SURVEYS:  Lower Extremity Functional Score: 35 / 80 = 43.8 %  COGNITION: Overall cognitive status: Within functional limits for tasks assessed     SENSATION: WFL  EDEMA:  Mod edema knee jt anterior  MUSCLE LENGTH: Hamstrings: Right wfl deg; Left wfl deg Debby test: Right wfl deg; Left wfl deg  POSTURE: No Significant postural limitations  PALPATION: Tender inferior patellar tendon. Normal movement patella all planes  LOWER EXTREMITY ROM:  Active ROM Right eval Left eval Left  AROM 10/12/24  Hip flexion     Hip extension     Hip abduction     Hip adduction     Hip internal rotation     Hip external rotation     Knee flexion 148 120 126  Knee extension 0 -20 5  Ankle dorsiflexion     Ankle plantarflexion     Ankle inversion     Ankle eversion      (Blank rows = not tested)  LOWER EXTREMITY MMT:  MMT Right eval Left eval  Hip flexion    Hip extension    Hip abduction  Pain  L knee, 4/5  Hip adduction    Hip internal rotation    Hip external rotation    Knee flexion  4/5  Knee extension  3+/5 pain  Ankle dorsiflexion  heel walk unable unable  Ankle plantarflexion toe walk unable unable  Ankle inversion    Ankle eversion     (Blank rows = not tested)  LOWER EXTREMITY SPECIAL TESTS:  na  FUNCTIONAL TESTS:  30 seconds chair stand test 9  GAIT: Distance walked: in clinic with brace on L knee Assistive device utilized: knee brace                                                                                                                                 TREATMENT DATE:  10/20/24 Nustep level 5 x 6 minutes Gait outside around the back building some uneven terrain Tmill pushes Slant board calf stretch Leg curls 35# 2x15 5# leg extension small ROM On bosu reaching upside down CGA Then ball toss on bosu CGA On airex ball toss Leg press 40#, the SL 20#  10/18/24 Nustep level 5 x 6 minutes Bike Level 4 x 5 minutes a little tight with this 50# resisted gait all directions Slant board stretch Tmill pushes fwd and bkwd 20 s x 3 40# leg press, left only after 2x10, 40# x10, 20# x10 35# Leg curls 2x10 5# leg extension, small ROM and working on TKE/VMO Feet on ball K2C, bridges, isometric abs SAQ 5# verbal and tactile cues for VMO 5# hip extension and abduction  10/15/24 Nustep level 5 x 6 minutes Leg curls 35# 2x15 LEg press 20# then left only , small ROM Calf stretch slant board Tmill pushes fwd and bkwd Feet on ball K2C, rotation,small bridge, isometric abs Ball b/n knees squeeze Blue tband clamshells 3# SAQ verbal and tactile cues for VMO  10/12/24 Nustep level 5 x 6 minutes Calf stretch 50# resisted gait all directions, CGA for this due to LOB 25# leg curls Leg press 20# small ROM, controlled motions, then left only no weight, then 20# left only 2x5 On airex cone toe touches On airex ball toss 5# SAQ 2x10, tactile cues for VMO Feet on ball K2C, rotation, bridge and isometric abs Ball b/n knees squeeze  10/06/24 Nustep level 5 x 5 minutes Calf stretch Feet on ball K2C, rotation, bridge, isometric abs SAQ tactile and verbal cues QS Passive ITB stretch Leg curls 25# Leg press 20# small ROM focus on slow controlled motions   09/20/24: Evaluation, education throughout regarding nature of her injury, expectations for recovery and time frame and purpose of exercise     PATIENT EDUCATION:  Education details: POC, goals Person educated: Patient Education method: Programmer, Multimedia, Demonstration, Verbal cues, and  Handouts Education comprehension: verbalized understanding  HOME EXERCISE PROGRAM: Instructed in use of blue t band for L long arc quads and for assisted seated SLR with L LE ER  ASSESSMENT:  CLINICAL  IMPRESSION: I spoke with patient and her mom, [name] is doing well, she is not having any pain and has not had any buckling lately.  I try to continue to push strength of the VMO, and added some hip strength today, she is weak in the hips, added more proprioception today, this was difficult for her.  Patient is a 19 y.o. female who was evaluated today by physical therapy for a diagnosis of L patellar dislocation, approximately 8 weeks ago.  She presents with edema L knee, pain infrapatellar tendon with palpation,decreased L knee ROM, weakness L LE, primarily L quadriceps. She should benefit from guided physical therapy to address her pain and dysfunction L LE.    OBJECTIVE IMPAIRMENTS: decreased activity tolerance, decreased balance, decreased endurance, decreased knowledge of condition, difficulty walking, decreased ROM, decreased strength, increased edema, impaired perceived functional ability, impaired flexibility, improper body mechanics, and pain.   ACTIVITY LIMITATIONS: carrying, lifting, bending, standing, squatting, transfers, and locomotion level  PARTICIPATION LIMITATIONS: cleaning, laundry, shopping, occupation, yard work, and school  PERSONAL FACTORS: Age, Behavior pattern, Education, Fitness, Past/current experiences, Time since onset of injury/illness/exacerbation, and 1-2 comorbidities: obesity,  are also affecting patient's functional outcome.   REHAB POTENTIAL: Good  CLINICAL DECISION MAKING: Evolving/moderate complexity  EVALUATION COMPLEXITY: Moderate   GOALS: Goals reviewed with patient? Yes  SHORT TERM GOALS: Target date: 2 weeks, 10/04/25 I HEP Baseline:initiated today Goal status: met 10/06/24   LONG TERM GOALS: Target date: 12/13/24, 12 weeks   30 sec sit to  stand greater than 18 without L knee pain Baseline:  Goal status: INITIAL  2.  LEFS improve from 43.8% deficit to 33% or less Baseline:  Goal status: progressing 10/20/24  3.  MMT L LE wnl Baseline: see chart Goal status: progressing 10/20/24  4.  Full ROM L knee for efficient sit to stand, mobility Baseline:  Goal status: met 10/12/24   PLAN:  PT FREQUENCY: 1-2x/week  PT DURATION: 12 weeks  PLANNED INTERVENTIONS: 97110-Therapeutic exercises, 97530- Therapeutic activity, 97112- Neuromuscular re-education, 97535- Self Care, [id number]- Manual therapy, and Patient/Family education  PLAN FOR NEXT SESSION: progress with stability, ROM, strengthening B LE's , continue to progress.  Proprioception, hip strength, may see a few more visits   Philmore Lepore W, PT,  10/20/2024, 5:00 PM  "

## 2024-10-20 NOTE — Telephone Encounter (Signed)
 Mother- Amy, called with patient next to her on phone. Pt has c/o breast/nipple pain bilaterally for greater than one year and requesting referral for imaging. Discussed pt was seen on 12/31, but pt did not recall to discuss w provider during visit. Advised pt needs to be seen with a provider to ensure we order appropriate testing for her concerns. Mother verbalized understanding. Pt phone is currently cut off and requesting call for appt to her number.

## 2024-10-25 ENCOUNTER — Ambulatory Visit: Admitting: Physical Therapy

## 2024-10-25 ENCOUNTER — Encounter: Payer: Self-pay | Admitting: Physical Therapy

## 2024-10-25 DIAGNOSIS — M6281 Muscle weakness (generalized): Secondary | ICD-10-CM

## 2024-10-25 DIAGNOSIS — M25562 Pain in left knee: Secondary | ICD-10-CM

## 2024-10-25 DIAGNOSIS — R262 Difficulty in walking, not elsewhere classified: Secondary | ICD-10-CM

## 2024-10-25 NOTE — Therapy (Signed)
 " OUTPATIENT PHYSICAL THERAPY LOWER EXTREMITY TREATMENT   Patient Name: Latoya Castillo MRN: [id number] DOB:[birth year], 19 y.o., female Today's Date: 10/25/2024  END OF SESSION:  PT End of Session - 10/25/24 1706     Visit Number 7    Authorization Type Medicaid 7 of 27    PT Start Time 1700    PT Stop Time 1745    PT Time Calculation (min) 45 min    Activity Tolerance Patient tolerated treatment well    Behavior During Therapy WFL for tasks assessed/performed           Past Medical History:  Diagnosis Date   Asthma    Obesity    Seasonal allergies    History reviewed. No pertinent surgical history. 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   Goiter 10/24/2016   Insulin  resistance 10/24/2016    PCP: [facility]  REFERRING PROVIDER: Juliene Bailey, MD  REFERRING DIAG: L patellar subluxation  THERAPY DIAG:  Acute pain of left knee  Difficulty in walking, not elsewhere classified  Muscle weakness (generalized)  Rationale for Evaluation and Treatment: Rehabilitation  ONSET DATE: Jul 25 2024  SUBJECTIVE:   SUBJECTIVE STATEMENT: I feel good, no issues recently, I think I will make this my last visit PERTINENT HISTORY: Fell at home, 2 x in a row, felt like something moved in her L knee, now pain and swelling PAIN:  Are you having pain? Yes: NPRS scale: 0 to 5 Pain location: L knee primarily inferior patellar tendon Pain description: aching, more stiff in am Aggravating factors: walking, lying supine  Relieving factors: rest  PRECAUTIONS: None  RED FLAGS: None   WEIGHT BEARING RESTRICTIONS: No  FALLS:  Has patient fallen in last 6 months? Yes. 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  DIAGNOSTIC FINDINGS: not available  PATIENT SURVEYS:  Lower Extremity Functional Score: 35 / 80 = 43.8 %  COGNITION: Overall cognitive status: Within functional limits for tasks assessed     SENSATION: WFL  EDEMA:  Mod edema knee jt anterior  MUSCLE LENGTH: Hamstrings: Right wfl deg; Left wfl deg Debby test: Right wfl deg; Left wfl deg  POSTURE: No Significant postural limitations  PALPATION: Tender inferior patellar tendon. Normal movement patella all planes  LOWER EXTREMITY ROM:  Active ROM Right eval Left eval Left  AROM 10/12/24  Hip flexion     Hip extension     Hip abduction     Hip adduction     Hip internal rotation     Hip external rotation     Knee flexion 148 120 126  Knee extension 0 -20 5  Ankle dorsiflexion     Ankle plantarflexion     Ankle inversion     Ankle eversion      (Blank rows =  not tested)  LOWER EXTREMITY MMT:  MMT Right eval Left eval  Hip flexion    Hip extension    Hip abduction  Pain L knee, 4/5  Hip adduction    Hip internal rotation    Hip external rotation    Knee flexion  4/5  Knee extension  3+/5 pain  Ankle dorsiflexion  heel walk unable unable  Ankle plantarflexion toe walk unable unable  Ankle inversion    Ankle eversion     (Blank rows = not tested)  LOWER EXTREMITY SPECIAL TESTS:  na  FUNCTIONAL TESTS:  30 seconds chair stand test 9  GAIT: Distance walked: in clinic with brace on L knee Assistive device utilized: knee brace                                                                                                                                 TREATMENT DATE:  10/25/24 Nustep level 5 x 6 minutes Bike level 4 x 5 minutes 35# leg curls 5# leg ext small ROM cues for VMO Discussion and education on HEP, use of brace, stretching etc.. went over some body mechanics Leg press 40# 2x10 small ROM, cues for VMO 5# hip extension and abduction 2x10 AROM 0-128 degrees flexion SLS 20 seconds Passive stretch LE's Really asked patient to focus on VMO, hip strength  10/20/24 Nustep level 5 x 6 minutes Gait outside around the back building some uneven terrain Tmill pushes Slant board calf stretch Leg curls 35# 2x15 5# leg extension small ROM On bosu reaching upside down CGA Then ball toss on bosu CGA On airex ball toss Leg press 40#, the SL 20#  10/18/24 Nustep level 5 x 6 minutes Bike Level 4 x 5 minutes a little tight with this 50# resisted gait all directions Slant board stretch Tmill pushes fwd and bkwd 20 s x 3 40# leg press, left only after 2x10, 40# x10, 20# x10 35# Leg curls 2x10 5# leg extension, small ROM and working on TKE/VMO Feet on ball K2C, bridges, isometric abs SAQ 5# verbal and tactile cues for VMO 5# hip extension and abduction  10/15/24 Nustep level 5 x 6 minutes Leg curls 35# 2x15 LEg press 20# then left only , small ROM Calf stretch slant board Tmill pushes fwd and bkwd Feet on ball K2C, rotation,small bridge, isometric abs Ball b/n knees squeeze Blue tband clamshells 3# SAQ verbal and tactile cues for VMO  10/12/24 Nustep level 5 x 6 minutes Calf stretch 50# resisted gait all directions, CGA for this due to LOB 25# leg curls Leg press 20# small ROM, controlled motions, then left only no weight, then 20# left only 2x5 On airex cone toe touches On airex ball toss 5# SAQ 2x10, tactile cues for VMO Feet on ball K2C, rotation, bridge and isometric abs Ball b/n knees  squeeze  10/06/24 Nustep level 5 x 5 minutes Calf stretch Feet on ball K2C, rotation,  bridge, isometric abs SAQ tactile and verbal cues QS Passive ITB stretch Leg curls 25# Leg press 20# small ROM focus on slow controlled motions   09/20/24: Evaluation, education throughout regarding nature of her injury, expectations for recovery and time frame and purpose of exercise     PATIENT EDUCATION:  Education details: POC, goals Person educated: Patient Education method: Programmer, Multimedia, Demonstration, Verbal cues, and Handouts Education comprehension: verbalized understanding  HOME EXERCISE PROGRAM: Instructed in use of blue t band for L long arc quads and for assisted seated SLR with L LE ER  ASSESSMENT:  CLINICAL IMPRESSION: Patient doing well, no pain.  Reports that she has not used the brace lately.  She feels confident in the knee as it has not had any buckling.  We reviewed the HEP and the importance of the VMO, flexibility and hip strength, she verbalized understanding and verbalized independence with HEP.  ROM much improved  Patient is a 19 y.o. female who was evaluated today by physical therapy for a diagnosis of L patellar dislocation, approximately 8 weeks ago.  She presents with edema L knee, pain infrapatellar tendon with palpation,decreased L knee ROM, weakness L LE, primarily L quadriceps. She should benefit from guided physical therapy to address her pain and dysfunction L LE.    OBJECTIVE IMPAIRMENTS: decreased activity tolerance, decreased balance, decreased endurance, decreased knowledge of condition, difficulty walking, decreased ROM, decreased strength, increased edema, impaired perceived functional ability, impaired flexibility, improper body mechanics, and pain.   ACTIVITY LIMITATIONS: carrying, lifting, bending, standing, squatting, transfers, and locomotion level  PARTICIPATION LIMITATIONS: cleaning, laundry, shopping, occupation, yard work, and school  PERSONAL  FACTORS: Age, Behavior pattern, Education, Fitness, Past/current experiences, Time since onset of injury/illness/exacerbation, and 1-2 comorbidities: obesity,  are also affecting patient's functional outcome.   REHAB POTENTIAL: Good  CLINICAL DECISION MAKING: Evolving/moderate complexity  EVALUATION COMPLEXITY: Moderate   GOALS: Goals reviewed with patient? Yes  SHORT TERM GOALS: Target date: 2 weeks, 10/04/25 I HEP Baseline:initiated today Goal status: met 10/06/24   LONG TERM GOALS: Target date: 12/13/24, 12 weeks   30 sec sit to stand greater than 18 without L knee pain Baseline:  Goal status: met 10/25/24  2.  LEFS improve from 43.8% deficit to 33% or less Baseline:  Goal status: met 10/25/24  3.  MMT L LE wnl Baseline: see chart Goal status: met 10/25/24  4.  Full ROM L knee for efficient sit to stand, mobility Baseline:  Goal status: met 10/12/24   PLAN:  PT FREQUENCY: 1-2x/week  PT DURATION: 12 weeks  PLANNED INTERVENTIONS: 97110-Therapeutic exercises, 97530- Therapeutic activity, 97112- Neuromuscular re-education, 97535- Self Care, [id number]- Manual therapy, and Patient/Family education  PLAN FOR NEXT SESSION: Goals met, will D/C   OBADIAH OZELL ORN, PT,  10/25/2024, 5:06 PM  "

## 2024-10-27 ENCOUNTER — Ambulatory Visit: Admitting: Physical Therapy

## 2024-12-09 ENCOUNTER — Ambulatory Visit: Payer: Self-pay | Admitting: Family Medicine
